# Patient Record
Sex: Female | Born: 1995 | Race: Black or African American | Hispanic: No | Marital: Single | Smoking: Never smoker
Health system: Southern US, Community
[De-identification: ages and names within clinical notes are randomized; demographics above are authoritative.]

## PROBLEM LIST (undated history)

## (undated) ENCOUNTER — Emergency Department (HOSPITAL_COMMUNITY): Admission: EM | Payer: Self-pay | Source: Home / Self Care

## (undated) DIAGNOSIS — O43219 Placenta accreta, unspecified trimester: Secondary | ICD-10-CM

## (undated) HISTORY — PX: LAPAROSCOPIC GASTRIC SLEEVE RESECTION: SHX5895

---

## 2021-05-31 ENCOUNTER — Inpatient Hospital Stay (HOSPITAL_COMMUNITY)
Admission: RE | Admit: 2021-05-31 | Discharge: 2021-05-31 | DRG: 833 | Disposition: A | Discharge status: Other Acute Inpatient Hospital | Payer: Medicaid Other | Attending: Obstetrics & Gynecology | Admitting: Obstetrics & Gynecology

## 2021-05-31 ENCOUNTER — Inpatient Hospital Stay (HOSPITAL_BASED_OUTPATIENT_CLINIC_OR_DEPARTMENT_OTHER): Payer: Medicaid Other

## 2021-05-31 ENCOUNTER — Other Ambulatory Visit: Payer: Self-pay

## 2021-05-31 ENCOUNTER — Encounter (HOSPITAL_COMMUNITY): Payer: Self-pay | Admitting: Obstetrics & Gynecology

## 2021-05-31 DIAGNOSIS — Z3A33 33 weeks gestation of pregnancy: Secondary | ICD-10-CM

## 2021-05-31 DIAGNOSIS — O4403 Placenta previa specified as without hemorrhage, third trimester: Principal | ICD-10-CM | POA: Diagnosis present

## 2021-05-31 DIAGNOSIS — O43213 Placenta accreta, third trimester: Secondary | ICD-10-CM | POA: Diagnosis not present

## 2021-05-31 DIAGNOSIS — O4693 Antepartum hemorrhage, unspecified, third trimester: Secondary | ICD-10-CM | POA: Diagnosis present

## 2021-05-31 DIAGNOSIS — Z3A34 34 weeks gestation of pregnancy: Secondary | ICD-10-CM

## 2021-05-31 DIAGNOSIS — O4400 Placenta previa specified as without hemorrhage, unspecified trimester: Secondary | ICD-10-CM | POA: Diagnosis present

## 2021-05-31 HISTORY — DX: Placenta accreta, unspecified trimester: O43.219

## 2021-05-31 LAB — URINALYSIS, ROUTINE W REFLEX MICROSCOPIC
Bilirubin Urine: NEGATIVE
Glucose, UA: NEGATIVE mg/dL
Ketones, ur: 20 mg/dL — AB
Leukocytes,Ua: NEGATIVE
Nitrite: NEGATIVE
Protein, ur: 30 mg/dL — AB
Specific Gravity, Urine: 1.029 (ref 1.005–1.030)
pH: 6 (ref 5.0–8.0)

## 2021-05-31 LAB — ABO/RH: ABO/RH(D): O POS

## 2021-05-31 MED ORDER — ZOLPIDEM TARTRATE 5 MG PO TABS: 5.0000 mg | ORAL_TABLET | Freq: Every evening | ORAL | Status: DC | PRN

## 2021-05-31 MED ORDER — SODIUM CHLORIDE 0.9 % IV SOLN: 250.0000 mL | INTRAVENOUS | Status: DC | PRN

## 2021-05-31 MED ORDER — PRENATAL MULTIVITAMIN CH: 1.0000 | ORAL_TABLET | Freq: Every day | ORAL | Status: DC

## 2021-05-31 MED ORDER — CALCIUM CARBONATE ANTACID 500 MG PO CHEW: 2.0000 | CHEWABLE_TABLET | ORAL | Status: DC | PRN

## 2021-05-31 MED ORDER — SODIUM CHLORIDE 0.9% FLUSH: 3.0000 mL | Freq: Two times a day (BID) | INTRAVENOUS | Status: DC

## 2021-05-31 MED ORDER — ACETAMINOPHEN 325 MG PO TABS: 650.0000 mg | ORAL_TABLET | ORAL | Status: DC | PRN

## 2021-05-31 MED ORDER — SODIUM CHLORIDE 0.9% FLUSH
3.0000 mL | INTRAVENOUS | Status: DC | PRN
Start: 2021-05-31 — End: 2021-06-01

## 2021-05-31 MED ORDER — DOCUSATE SODIUM 100 MG PO CAPS: 100.0000 mg | ORAL_CAPSULE | Freq: Every day | ORAL | Status: DC

## 2021-05-31 NOTE — MAU Note (Signed)
Brittany Grimes is a 26 y.o. at [redacted]w[redacted]d here in MAU reporting: woke up this AM with spotting, states it was blood mixed in with mucus so unsure if it was her mucus plug. No pain. No LOF. +FM ? ?Receives care in [place], has a low lying placenta and was told there are some blood vessels going into her previously existing c/s incision. Planning to deliver at 37 wk with hysterectomy.  ? ?Onset of complaint: today ? ?Pain score: 0/10 ? ?Vitals:  ? 05/31/21 1435  ?BP: (!) 122/55  ?Pulse: 69  ?Resp: 16  ?Temp: 98.9 ?F (37.2 ?C)  ?SpO2: 95%  ?   ?FHT:135 ? ?Lab orders placed from triage: UA ? ?

## 2021-05-31 NOTE — Progress Notes (Signed)
Patient ID: Brittany Grimes, female   DOB: 08-24-1995, 26 y.o.   MRN: [id number] ?Patient is admitted after noting vaginal bleeding this morning. She receives care in [place] and has the diagnosis of placenta accreta. She was to have cesarean section and possible hysterectomy. [place] by [place] is also suspicious for accreta and placenta previa. I consulted my partner Dr. Macon Large and we decided to transfer to a higher level medical center. Dr. Ceasar Lund at [facility] accepts transfer. Patient was counseled and accepts transfer. ? ?Adam Phenix, MD ?05/31/2021 ?8:42 PM ? ?

## 2021-05-31 NOTE — H&P (Signed)
?History  ?  ? ?CSN: [id number] ? ?Arrival date and time: 05/31/21 1405 ? ? Event Date/Time  ? First Provider Initiated Contact with Patient 05/31/21 1504   ?  ? ?Chief Complaint  ?Patient presents with  ? Vaginal Bleeding  ? ?HPI ?Brittany Grimes is a 26 y.o. [id number] at [redacted]w[redacted]d who presents with vaginal bleeding. She states she is from [place] and in town for [employer]. She reports she had bright red spotting when she wiped in the bathroom so she came to be seen. She denies any pain or leaking of fluid. She reports normal fetal movement.  ? ?The patient reports she has a low lying placenta with vessels growing into her previous c/s incision site. She is scheduled for a c/s with hysterectomy at 37 weeks. CNM asked multiple clarifying questions and patient reports it is not a previa, it is low lying and it is an acreeta diagnosed with ultrasound. She reports she has not had an MRI this pregnancy.  ? ?OB History   ? ? Gravida  ?4  ? Para  ?2  ? Term  ?2  ? Preterm  ?0  ? AB  ?1  ? Living  ?2  ?  ? ? SAB  ?1  ? IAB  ?0  ? Ectopic  ?0  ? Multiple  ?0  ? Live Births  ?2  ?   ?  ?  ? ? ?Past Medical History:  ?Diagnosis Date  ? Placenta accreta   ? ? ?Past Surgical History:  ?Procedure Laterality Date  ? CESAREAN SECTION    ? LAPAROSCOPIC GASTRIC SLEEVE RESECTION    ? ? ?History reviewed. No pertinent family history. ? ?Social History  ? ?Tobacco Use  ? Smoking status: Never  ? Smokeless tobacco: Never  ?Substance Use Topics  ? Alcohol use: Not Currently  ? Drug use: Never  ? ? ?Allergies: No Known Allergies ? ?Medications Prior to Admission  ?Medication Sig Dispense Refill Last Dose  ? Prenatal Vit-Fe Fumarate-FA (PRENATAL MULTIVITAMIN) TABS tablet Take 1 tablet by mouth daily at 12 noon.     ? ? ?Review of Systems  ?Constitutional: Negative.  Negative for fatigue and fever.  ?HENT: Negative.    ?Respiratory: Negative.  Negative for shortness of breath.   ?Cardiovascular: Negative.  Negative for chest pain.   ?Gastrointestinal: Negative.  Negative for abdominal pain, constipation, diarrhea, nausea and vomiting.  ?Genitourinary:  Positive for vaginal bleeding. Negative for dysuria and vaginal discharge.  ?Neurological: Negative.  Negative for dizziness and headaches.  ?Physical Exam  ? ?Blood pressure (!) 110/52, pulse 70, temperature 98.9 ?F (37.2 ?C), temperature source Oral, resp. rate 16, height  (1.575 m), weight 105.8 kg, SpO2 95 %. ? ?Physical Exam ?Vitals and nursing note reviewed.  ?Constitutional:   ?   General: She is not in acute distress. ?   Appearance: She is well-developed.  ?HENT:  ?   Head: Normocephalic.  ?Eyes:  ?   Pupils: Pupils are equal, round, and reactive to light.  ?Cardiovascular:  ?   Rate and Rhythm: Normal rate and regular rhythm.  ?   Heart sounds: Normal heart sounds.  ?Pulmonary:  ?   Effort: Pulmonary effort is normal. No respiratory distress.  ?   Breath sounds: Normal breath sounds.  ?Abdominal:  ?   General: Bowel sounds are normal. There is no distension.  ?   Palpations: Abdomen is soft.  ?   Tenderness: There is no abdominal tenderness.  ?  Genitourinary: ?   Comments: SSE: small amount of light red blood with moderate amount of brown blood in vault. Cervix visually closed ?Skin: ?   General: Skin is warm and dry.  ?Neurological:  ?   Mental Status: She is alert and oriented to person, place, and time.  ?Psychiatric:     ?   Mood and Affect: Mood normal.     ?   Behavior: Behavior normal.     ?   Thought Content: Thought content normal.     ?   Judgment: Judgment normal.  ? ?Fetal Tracing: ? ?Baseline: 130 ?Variability: moderate ?Accels: 15x15 ?Decels: none ? ?Toco: none ? ? ?MAU Course  ?Procedures ?Results for orders placed or performed during the hospital encounter of 05/31/21 (from the past 24 hour(s))  ?Urinalysis, Routine w reflex microscopic Urine, Clean Catch     Status: Abnormal  ? Collection Time: 05/31/21  2:36 PM  ?Result Value Ref Range  ? Color, Urine AMBER (A)  YELLOW  ? APPearance HAZY (A) CLEAR  ? Specific Gravity, Urine 1.029 1.005 - 1.030  ? pH 6.0 5.0 - 8.0  ? Glucose, UA NEGATIVE NEGATIVE mg/dL  ? Hgb urine dipstick LARGE (A) NEGATIVE  ? Bilirubin Urine NEGATIVE NEGATIVE  ? Ketones, ur 20 (A) NEGATIVE mg/dL  ? Protein, ur 30 (A) NEGATIVE mg/dL  ? Nitrite NEGATIVE NEGATIVE  ? Leukocytes,Ua NEGATIVE NEGATIVE  ? RBC / HPF 0-5 0 - 5 RBC/hpf  ? WBC, UA 0-5 0 - 5 WBC/hpf  ? Bacteria, UA RARE (A) NONE SEEN  ? Squamous Epithelial / LPF 21-50 0 - 5  ? Mucus PRESENT   ?ABO/Rh     Status: None  ? Collection Time: 05/31/21  4:06 PM  ?Result Value Ref Range  ? ABO/RH(D)    ?  O POS ?Performed at [facility], [address] [place], [place] [address] ?  ?  ?[place] MFM OB Limited ? ?Result Date: 05/31/2021 ?----------------------------------------------------------------------  OBSTETRICS REPORT                        (Signed Final 05/31/2021 07:00 pm) ---------------------------------------------------------------------- Patient Info  ID #:       [id number]                          D.O.B.:  November 29, 1995 (26 yrs)  Name:       Brittany Grimes                  Visit Date: 05/31/2021 03:20 pm ---------------------------------------------------------------------- Performed By  Attending:        Lin Landsman      Ref. Address:      Faculty                    MD  Performed By:     Birdena Crandall        [facility]  Referred By:      Elisha Headland  Starlett Pehrson CNM ---------------------------------------------------------------------- Orders  #  Description                           Code        Ordered By  1  US MFM OB LIMITED                     [id number]    Miryah Ralls ----------------------------------------------------------------------  #  Order #                     Accession #                Episode #  1  [id number]                   [id number]                 [id number]  ---------------------------------------------------------------------- Indications  Vaginal bleeding in pregnancy, third trimester  O46.93  Placenta accreta in third trimester             O43.213  Low lying placenta, antepartum                  O44.40  Obesity complicating pregnancy, third           O99.213  trimester  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 gastric       O99.843  bypass, antepartum, third trimester  History of cesarean delivery, currently         O34.219  pregnant  [redacted] weeks gestation of pregnancy                 Z3A.33 ---------------------------------------------------------------------- Fetal Evaluation  Num Of Fetuses:          1  Fetal Heart Rate(bpm):   150  Cardiac Activity:        Observed  Presentation:            Cephalic  Placenta:                Anterior previa,  Amniotic Fluid  AFI FV:      Within normal limits  AFI Sum(cm)     %Tile       Largest Pocket(cm)  15              53          6  RUQ(cm)       RLQ(cm)       LUQ(cm)        LLQ(cm)  6             2.4           1.7            4.9 ---------------------------------------------------------------------- OB History  Gravidity:    4         Term:   2         SAB:   1  Living:       2 ---------------------------------------------------------------------- Gestational Age  LMP:           33w 6d        Date:  10/06/20                  EDD:   07/13/21  Best:          33w 6d     Det. By:  LMP  (10/06/20)  EDD:   07/13/21 ---------------------------------------------------------------------- Anatomy  Cranium:               Appears normal         Heart:                  Appears normal                                                                        (4CH, axis, and                                                                        situs)  Cavum:                 Appears normal         RVOT:                   Appears normal  Ventricles:            Appears normal         Stomach:                Appears normal, left                                                                         sided  Nuchal Fold:           Not applicable (>20    Abdomen:                Appears normal                         wks GA)  Face:                  Orbits appear

## 2021-06-01 NOTE — Discharge Summary (Signed)
Patient ID: ?Lowry Bowl ?MRN: [id number] ?DOB/AGE: 1995-04-21 26 y.o. ? ?Admit date: 05/31/2021 ?Discharge date: 06/01/2021 ? ?Admission Diagnoses:vaginal bleeding third trimester ? ?Discharge Diagnoses: Placenta previa and accreta ? ?Prenatal Procedures: ultrasound ? ?Consults: Neonatology, Maternal Fetal Medicine ? ?Hospital Course:  ?This is a 26 y.o. [id number] with IUP at [redacted]w[redacted]d admitted for vaginal bleeding the day of admission. She states she is from [place] and in town for [employer]. She reports she had bright red spotting when she wiped in the bathroom so she came to be seen. She denies any pain or leaking of fluid. She reports normal fetal movement.  ? ?The patient reports she has a low lying placenta with vessels growing into her previous c/s incision site. She is scheduled for a c/s with hysterectomy at 37 weeks. CNM asked multiple clarifying questions and patient reports it is not a previa, it is low lying and it is an acreeta diagnosed with ultrasound. She reports she has not had an MRI this pregnancy.  ?Discharge Exam: ?Temp:  [98 ?F (36.7 ?C)-98.9 ?F (37.2 ?C)] 98 ?F (36.7 ?C) (05/06 2223) ?Pulse Rate:  [69-73] 72 (05/06 1946) ?Resp:  [16-18] 18 (05/06 2223) ?BP: (110-122)/(52-71) 116/69 (05/06 2223) ?SpO2:  [95 %-100 %] 99 % (05/06 2223) ?Weight:  [105.8 kg] 105.8 kg (05/06 1429) ?Physical Examination: ?CONSTITUTIONAL: Well-developed, well-nourished female in no acute distress.  ?HENT:  Normocephalic, atraumatic, External right and left ear normal. Oropharynx is clear and moist ?EYES: Conjunctivae and EOM are normal. Pupils are equal, round, and reactive to light. No scleral icterus.  ?NECK: Normal range of motion, supple, no masses ?SKIN: Skin is warm and dry. No rash noted. Not diaphoretic. No erythema. No pallor. ?NEUROLGIC: Alert and oriented to person, place, and time. Normal reflexes, muscle tone coordination. No cranial nerve deficit noted. ?PSYCHIATRIC: Normal mood and affect. Normal  behavior. Normal judgment and thought content. ?CARDIOVASCULAR: Normal heart rate noted, regular rhythm ?RESPIRATORY: Effort and breath sounds normal, no problems with respiration noted ?MUSCULOSKELETAL: Normal range of motion. No edema and no tenderness. 2+ distal pulses. ?ABDOMEN: Soft, nontender, nondistended, gravid. ?CERVIX: Dilation: Closed (appears closed on speculum exam) ?Exam by:: Cleone Slim, CNM ? ?Fetal monitoring: FHR: 150 bpm, Variability: moderate, Accelerations: Present, Decelerations: Absent  ?Uterine activity: 0 contractions per hour ? ?Significant Diagnostic Studies:  ?Results for orders placed or performed during the hospital encounter of 05/31/21 (from the past 168 hour(s))  ?Urinalysis, Routine w reflex microscopic Urine, Clean Catch  ? Collection Time: 05/31/21  2:36 PM  ?Result Value Ref Range  ? Color, Urine AMBER (A) YELLOW  ? APPearance HAZY (A) CLEAR  ? Specific Gravity, Urine 1.029 1.005 - 1.030  ? pH 6.0 5.0 - 8.0  ? Glucose, UA NEGATIVE NEGATIVE mg/dL  ? Hgb urine dipstick LARGE (A) NEGATIVE  ? Bilirubin Urine NEGATIVE NEGATIVE  ? Ketones, ur 20 (A) NEGATIVE mg/dL  ? Protein, ur 30 (A) NEGATIVE mg/dL  ? Nitrite NEGATIVE NEGATIVE  ? Leukocytes,Ua NEGATIVE NEGATIVE  ? RBC / HPF 0-5 0 - 5 RBC/hpf  ? WBC, UA 0-5 0 - 5 WBC/hpf  ? Bacteria, UA RARE (A) NONE SEEN  ? Squamous Epithelial / LPF 21-50 0 - 5  ? Mucus PRESENT   ?ABO/Rh  ? Collection Time: 05/31/21  4:06 PM  ?Result Value Ref Range  ? ABO/RH(D)    ?  O POS ?Performed at [facility], [address] [place], [place] [address] ?  ? ? ?Discharge Condition: Stable ? ?  Disposition: Discharge disposition: [facility] Not Defined ? ? ? ? ?Due to the ;high risk of hemorrhage requiring multiple transfusion and hysterectomy the team recommended transfer to a higher level OB center at [facility], Dr. Ceasar Lund accepted transfer to their L&D ? ? ?Discharge Instructions   ? ? Discharge patient   Complete by: As directed ?   ? Discharge disposition: [facility] Not Defined  ? Discharge patient date: 06/01/2021  ? ?  ? ?Allergies as of 06/01/2021   ?No Known Allergies ?  ? ?  ?Medication List  ?  ? ?TAKE these medications   ? ?prenatal multivitamin Tabs tablet ?Take 1 tablet by mouth daily at 12 noon. ?  ? ?  ? ? ? ?Signed: ?Scheryl Darter M.D. ?06/01/2021, 4:50 AM  ?

## 2023-04-30 IMAGING — US US MFM OB LIMITED
1 series · 14 of 28 positions shown · non-contrast
Comparison: none

[Series 1: us mfm ob limited · 66 acquisitions, 14 frames shown]
[im 3/66]
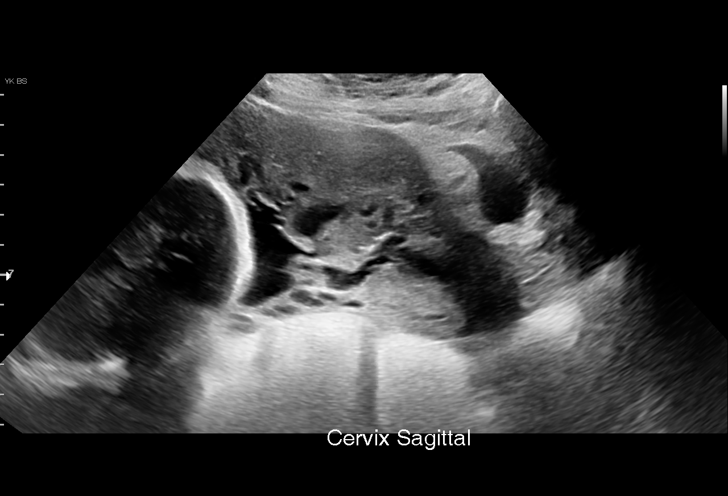
[im 8/66]
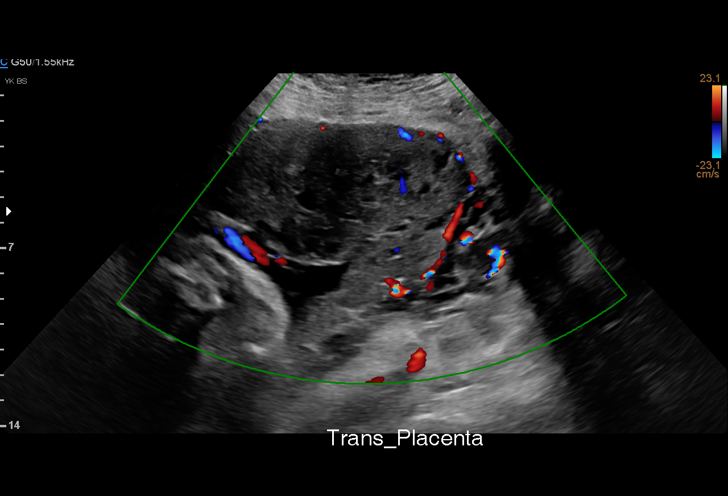
[im 13/66]
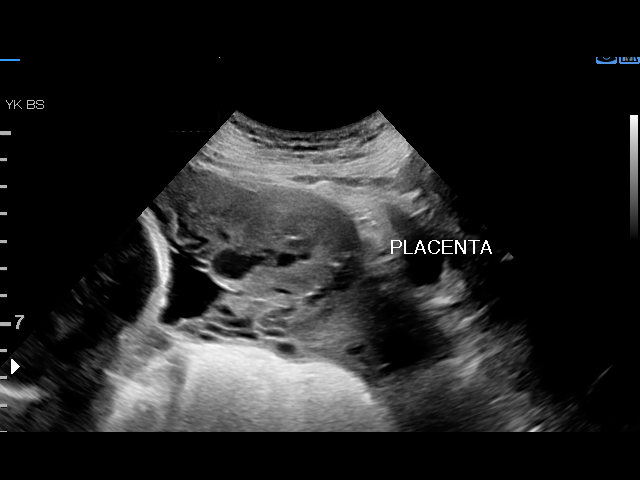
[im 17/66]
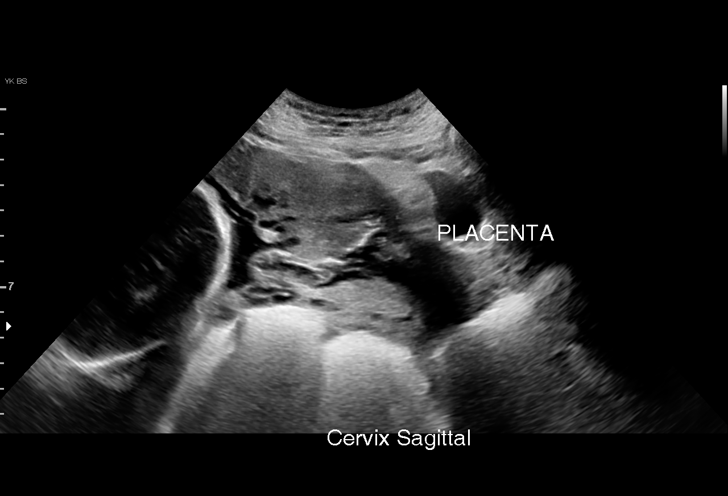
[im 22/66]
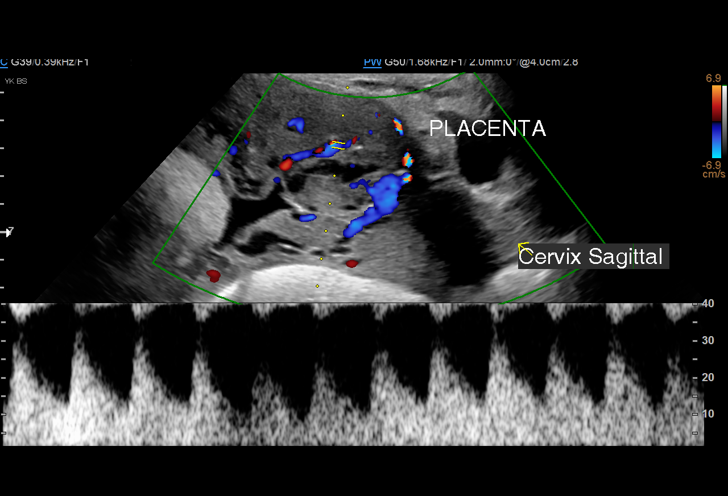
[im 27/66]
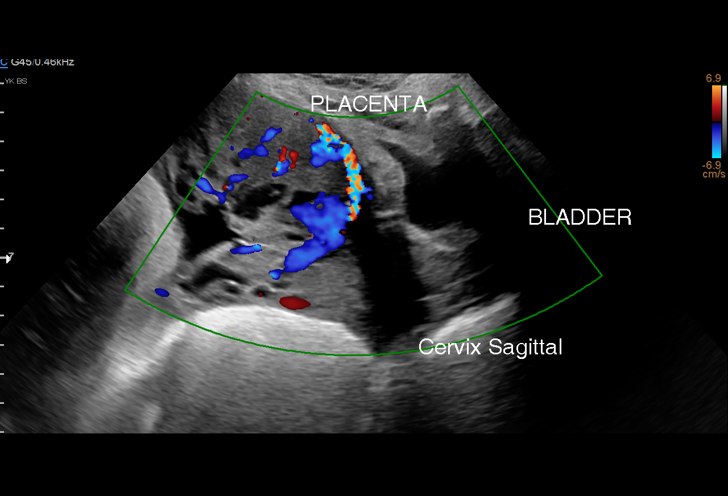
[im 32/66]
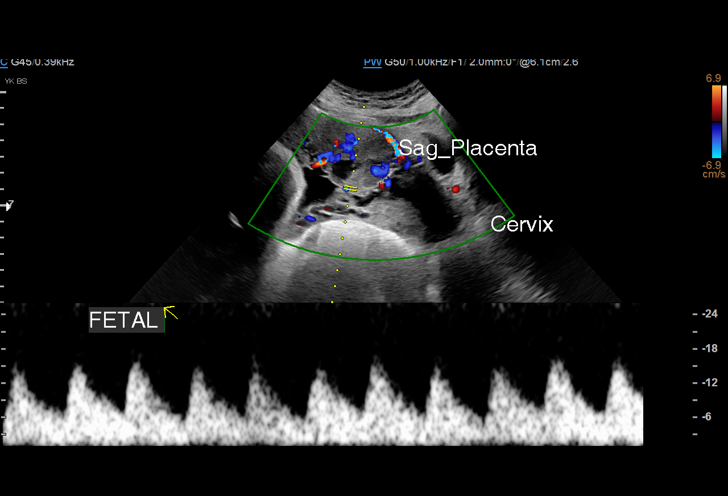
[im 37/66]
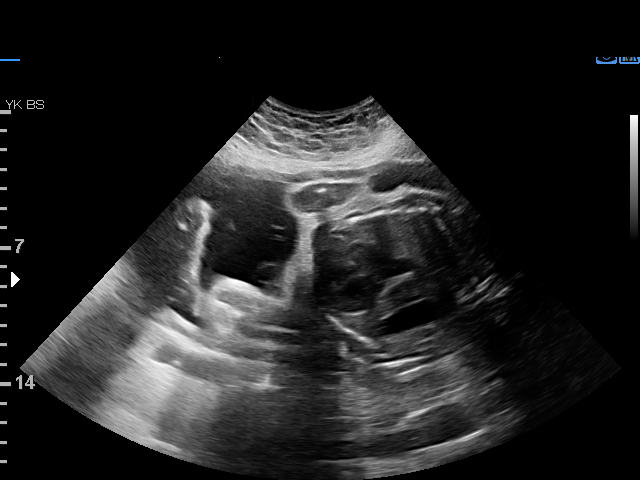
[im 41/66]
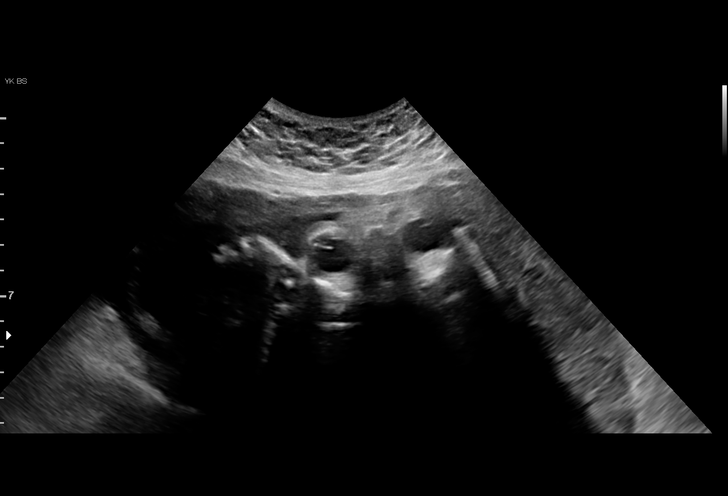
[im 46/66]
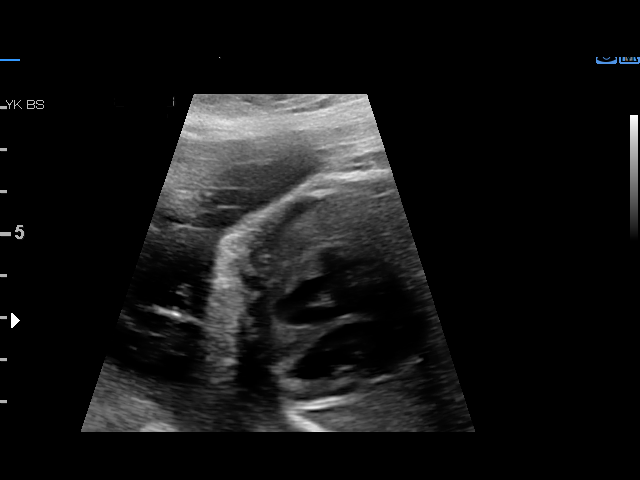
[im 51/66]
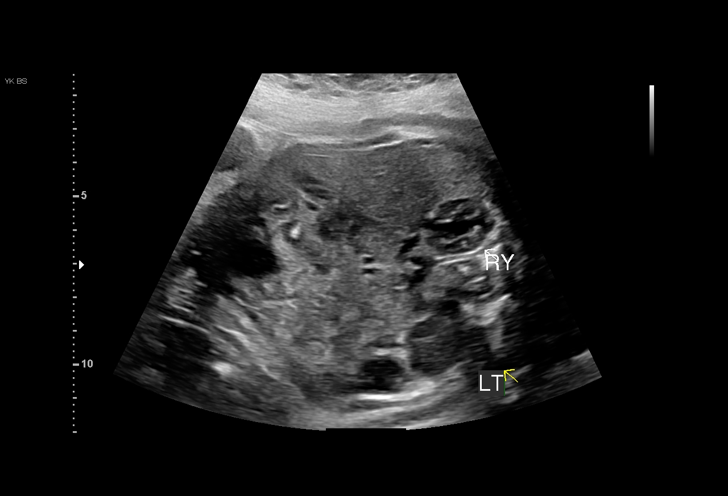
[im 56/66]
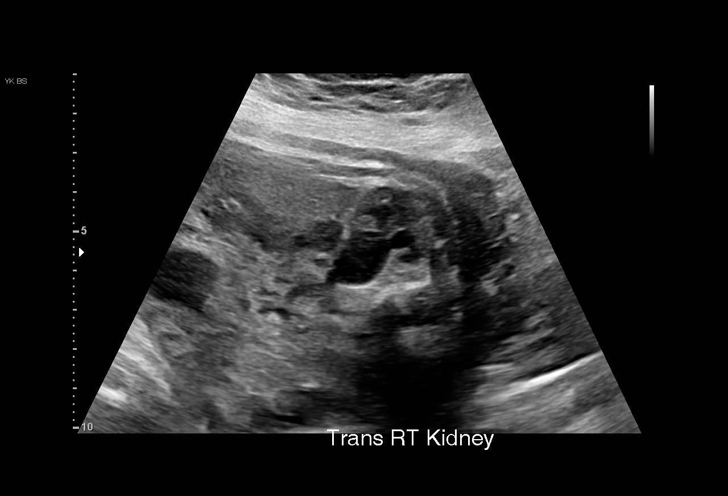
[im 61/66]
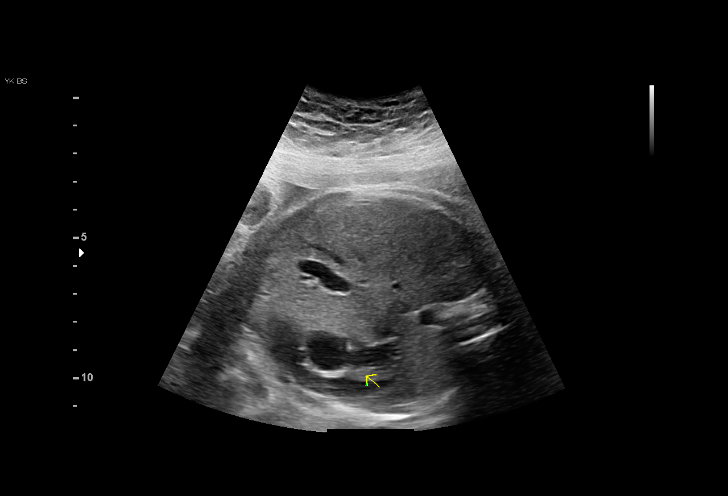
[im 66/66]
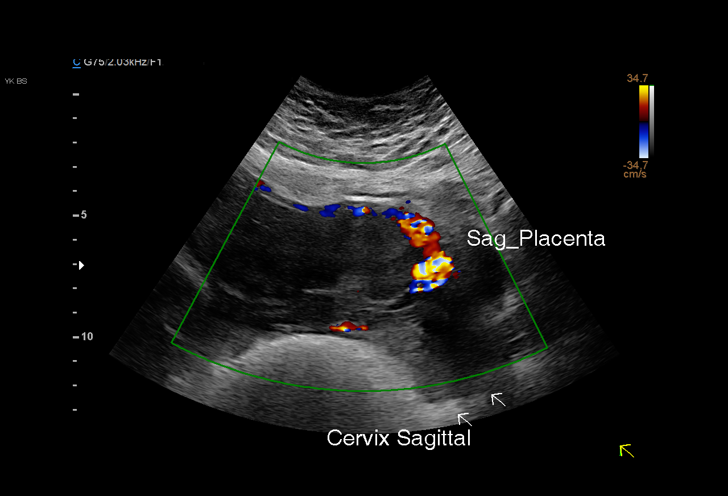

[14 of 28 positions shown; findings below may reference images not displayed]

1  US MFM OB LIMITED                     [id number]    KEYLI TIGER

Indications

 Vaginal bleeding in pregnancy, third trimester
 Placenta accreta in third trimester
 Low lying placenta, antepartum
 Obesity complicating pregnancy, third
 trimester
 Pregnancy complicated by previous gastric
 bypass, antepartum, third trimester
 History of cesarean delivery, currently
 pregnant
 33 weeks gestation of pregnancy
Fetal Evaluation

 Num Of Fetuses:          1
 Fetal Heart Rate(bpm):   150
 Cardiac Activity:        Observed
 Presentation:            Cephalic
 Placenta:                Anterior previa,

 Amniotic Fluid
 AFI FV:      Within normal limits

 AFI Sum(cm)     %Tile       Largest Pocket(cm)
 15              53          6

 RUQ(cm)       RLQ(cm)       LUQ(cm)        LLQ(cm)
 6
OB History

 Gravidity:    4         Term:   2         SAB:   1
 Living:       2
Gestational Age

 LMP:           33w 6d        Date:  10/06/20                  EDD:   07/13/21
 Best:          33w 6d     Det. By:  LMP  (10/06/20)          EDD:   07/13/21
Anatomy

 Cranium:               Appears normal         Heart:                  Appears normal
                                                                       (4CH, axis, and
                                                                       situs)
 Cavum:                 Appears normal         RVOT:                   Appears normal
 Ventricles:            Appears normal         Stomach:                Appears normal, left
                                                                       sided
 Nuchal Fold:           Not applicable (>20    Abdomen:                Appears normal
                        wks GA)
 Face:                  Orbits appear          Cord Vessels:           Appears normal (3
                        normal                                         vessel cord)
 Lips:                  Appears normal         Kidneys:                Right sided
                                                                       pyelectasis,12  mm
 Palate:                Appears normal         Bladder:                Appears normal
Cervix Uterus Adnexa

 Cervix
 Length:            3.4  cm.
 Closed
Impression

 Limited exam due to maternal  vaginal bleeding
 Good fetal movement and amniotic fluid.

 There is a placenta previa with characteristic suggestive of
 placenta accreta, including hypervascularity and several
 lacunae.

 She has a prior cesarean delivery.

 If persistent bleeding and delivery contemplated consider
 counseling regarding hysterectomy and with blood products
 and IV access available.

 Secondly right sided pyelectasis noted > 7 mm suggestive of
 1.2 cm however, suboptimal views for this measurement, but
 best largest AP diameter noted.

 Yervin Brayan follow up recommended.
Recommendations

 Clinical correlation recommend.
